# Patient Record
Sex: Female | Born: 1960 | Race: White | Hispanic: No | Marital: Married | Smoking: Never smoker
Health system: Southern US, Community
[De-identification: ages and names within clinical notes are randomized; demographics above are authoritative.]

## PROBLEM LIST (undated history)

## (undated) DIAGNOSIS — E079 Disorder of thyroid, unspecified: Secondary | ICD-10-CM

## (undated) DIAGNOSIS — C189 Malignant neoplasm of colon, unspecified: Secondary | ICD-10-CM

---

## 2019-09-20 ENCOUNTER — Ambulatory Visit: Payer: Self-pay | Attending: Internal Medicine

## 2019-09-20 DIAGNOSIS — Z23 Encounter for immunization: Secondary | ICD-10-CM

## 2019-09-20 NOTE — Progress Notes (Signed)
   Covid-19 Vaccination Clinic  Name:  Patricia Woods    MRN: [id number] DOB: 06/18/1961  09/20/2019  [name] was observed post Covid-19 immunization for 15 minutes without incident. She was provided with Vaccine Information Sheet and instruction to access the V-Safe system.   [name] was instructed to call 911 with any severe reactions post vaccine: Marland Kitchen Difficulty breathing  . Swelling of face and throat  . A fast heartbeat  . A bad rash all over body  . Dizziness and weakness   Immunizations Administered    Name Date Dose VIS Date Route   Pfizer COVID-19 Vaccine 09/20/2019  2:30 PM 0.3 mL 06/14/2019 Intramuscular   Manufacturer: [place]   Lot: [id number]   [place]: [id number]

## 2019-10-11 ENCOUNTER — Ambulatory Visit: Payer: Self-pay | Attending: Internal Medicine

## 2019-10-11 DIAGNOSIS — Z23 Encounter for immunization: Secondary | ICD-10-CM

## 2019-10-11 NOTE — Progress Notes (Signed)
   Covid-19 Vaccination Clinic  Name:  Danniell Morvant    MRN: [id number] DOB: 24-Nov-1960  10/11/2019  Ms. Slocumb was observed post Covid-19 immunization for 15 minutes without incident. She was provided with Vaccine Information Sheet and instruction to access the V-Safe system.   Ms. Proper was instructed to call 911 with any severe reactions post vaccine: Marland Kitchen Difficulty breathing  . Swelling of face and throat  . A fast heartbeat  . A bad rash all over body  . Dizziness and weakness   Immunizations Administered    Name Date Dose VIS Date Route   Pfizer COVID-19 Vaccine 10/11/2019  2:01 PM 0.3 mL 06/14/2019 Intramuscular   Manufacturer: [place]   Lot: [id number]   [place]: [id number]

## 2019-10-11 NOTE — Progress Notes (Signed)
   Covid-19 Vaccination Clinic  Name:  Patricia Woods    MRN: [id number] DOB: 08-Jul-1960  10/11/2019  Patricia Woods was observed post Covid-19 immunization for 15 minutes .  During the observation period, she experienced an adverse reaction with the following symptoms:  loss of consciousness, nausea and patient had left site after observation with spouse, spouse states he panicked and couldn't remember where the hospital was. As they were still close to the vaccine clinic site, he brought her back here..  Assessment : Time of assessment 1455. Alert and oriented, Pallor, Diaphoretic and Nausea.  Actions taken:  Vitals sign taken  VAERS form completed 1457  B/P 124/71    O2 Sat 99%    HR  80   Blood Glucose 149   Medications administered: No medication administered.  Disposition: Reports no further symptoms of adverse reaction after observation for 15 minutes. Discharged home. Instructed to call 911 for trouble breathing, rapid heart rate, dizziness, swelling of tongue or throat.   Immunizations Administered    Name Date Dose VIS Date Route   Pfizer COVID-19 Vaccine 10/11/2019  2:01 PM 0.3 mL 06/14/2019 Intramuscular   Manufacturer: [place]   Lot: [id number]   [place]: [id number]

## 2019-10-15 ENCOUNTER — Telehealth: Payer: Self-pay | Admitting: *Deleted

## 2019-10-15 NOTE — Telephone Encounter (Signed)
Called patient and left a voicemail in regards to their second COVID vaccine that she received on April 9th. Will follow up.

## 2019-10-15 NOTE — Telephone Encounter (Signed)
Patient called back and stated that when she received her vaccine she felt fine but when they were in the car riding home she started to feel dizzy and nauseous and passed out in the car. She doesn't remember passing her out, her husband drove her back to the facility where she was sweating and she did notice tingling in her hands. 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 She states that she has passed out a couple times during her chemo treatment in the past. She states that she does not have a history of allergic reactions to other vaccines, she chooses not to get the Flu vaccine because she does not like the flu like symptoms she gets from it. Please advise if there is any need for concern for future vaccine booster. Thank You.

## 2019-10-16 NOTE — Telephone Encounter (Signed)
Please call patient back.   As this occurred after her second covid vaccine nothing to do as of now as she is fully vaccinated.  In the future, if there are booster vaccinations that are needed then recommend to call our office back at that time. We just don't have much information about what to do with these patients who are fully vaccinated right now.

## 2019-10-16 NOTE — Telephone Encounter (Signed)
Called and left a voicemail asking for patient to return call to inform.  

## 2019-10-16 NOTE — Telephone Encounter (Signed)
Patient called back and was advised per Dr. Maudie Mercury. Patient verbalized understanding and will call back for future booster.

## 2019-12-04 ENCOUNTER — Ambulatory Visit (INDEPENDENT_AMBULATORY_CARE_PROVIDER_SITE_OTHER): Payer: Managed Care, Other (non HMO)

## 2019-12-04 ENCOUNTER — Other Ambulatory Visit: Payer: Self-pay

## 2019-12-04 ENCOUNTER — Ambulatory Visit
Admission: EM | Admit: 2019-12-04 | Discharge: 2019-12-04 | Disposition: A | Discharge status: Home / Self Care | Payer: Managed Care, Other (non HMO) | Attending: Family Medicine | Admitting: Family Medicine

## 2019-12-04 ENCOUNTER — Encounter: Payer: Self-pay | Admitting: Emergency Medicine

## 2019-12-04 DIAGNOSIS — S80211A Abrasion, right knee, initial encounter: Secondary | ICD-10-CM

## 2019-12-04 DIAGNOSIS — S8001XA Contusion of right knee, initial encounter: Secondary | ICD-10-CM | POA: Diagnosis not present

## 2019-12-04 HISTORY — DX: Disorder of thyroid, unspecified: E07.9

## 2019-12-04 HISTORY — DX: Malignant neoplasm of colon, unspecified: C18.9

## 2019-12-04 NOTE — ED Triage Notes (Signed)
Patient states she fell in a hole today and hit her right knee on a concrete curb. Patient has 2 skin abrasions to her right knee. Patient is c/o pain with walking.

## 2019-12-04 NOTE — Discharge Instructions (Addendum)
Rest, ice, elevation, over the counter, topical antibiotic for abrasion, over the counter ibuprofen/tylenol as needed

## 2019-12-08 NOTE — ED Provider Notes (Signed)
[facility]    CSN: [id number] Arrival date & time: 12/04/19  1434      History   Chief Complaint Chief Complaint  Patient presents with  . Knee Injury    right    HPI Patricia Woods is a 59 y.o. female.   59 yo female with a c/o right knee pain after injuring it today. States she fell in a hole today as she was getting out of her car and fell, hitting and scraping her right knee. Has some skin abrasions and c/o pain with ambulation.      Past Medical History:  Diagnosis Date  . Colon cancer ([place])   . Thyroid disease     There are no problems to display for this patient.   History reviewed. No pertinent surgical history.  OB History   No obstetric history on file.      Home Medications    Prior to Admission medications   Medication Sig Start Date End Date Taking? Authorizing Provider  fluticasone (FLONASE) 50 MCG/ACT nasal spray Place into the nose. 06/19/15  Yes [provider]  levothyroxine (SYNTHROID) 175 MCG tablet Take by mouth. 12/25/18  Yes [provider]    Family History History reviewed. No pertinent family history.  Social History Social History   Tobacco Use  . Smoking status: Never Smoker  . Smokeless tobacco: Never Used  Substance Use Topics  . Alcohol use: Yes  . Drug use: Never     Allergies   Patient has no known allergies.   Review of Systems Review of Systems   Physical Exam Triage Vital Signs ED Triage Vitals  Enc Vitals Group     BP 12/04/19 1451 (!) 143/92     Pulse Rate 12/04/19 1451 79     Resp 12/04/19 1451 18     Temp 12/04/19 1451 97.9 F (36.6 C)     Temp Source 12/04/19 1451 Oral     SpO2 12/04/19 1451 98 %     Weight 12/04/19 1448 180 lb (81.6 kg)     Height 12/04/19 1448 5\' 8"  (1.727 m)     Head Circumference --      Peak Flow --      Pain Score 12/04/19 1448 6     Pain Loc --      Pain Edu? --      Excl. in [place]? --    No data found.  Updated Vital Signs BP (!)  143/92 (BP Location: Right Arm)   Pulse 79   Temp 97.9 F (36.6 C) (Oral)   Resp 18   Ht 5\' 8"  (1.727 m)   Wt 81.6 kg   SpO2 98%   BMI 27.37 kg/m   Visual Acuity Right Eye Distance:   Left Eye Distance:   Bilateral Distance:    Right Eye Near:   Left Eye Near:    Bilateral Near:     Physical Exam Vitals and nursing note reviewed.  Constitutional:      General: She is not in acute distress.    Appearance: She is not toxic-appearing or diaphoretic.  Musculoskeletal:     Right knee: Swelling (mild) present. No deformity, effusion, erythema, ecchymosis, lacerations or crepitus. Tenderness (diffuse ) present. Normal alignment, normal meniscus and normal patellar mobility. Normal pulse.     Comments: Right lower extremity neurovascularly intact; superficial skin abrasions noted over the knee  Neurological:     Mental Status: She is alert.  UC Treatments / Results  Labs (all labs ordered are listed, but only abnormal results are displayed) Labs Reviewed - No data to display  EKG   Radiology No results found.  Procedures Procedures (including critical care time)  Medications Ordered in UC Medications - No data to display  Initial Impression / Assessment and Plan / UC Course  I have reviewed the triage vital signs and the nursing notes.  Pertinent labs & imaging results that were available during my care of the patient were reviewed by me and considered in my medical decision making (see chart for details).      Final Clinical Impressions(s) / UC Diagnoses   Final diagnoses:  Contusion of right knee, initial encounter  Abrasion, right knee, initial encounter     Discharge Instructions     Rest, ice, elevation, over the counter, topical antibiotic for abrasion, over the counter ibuprofen/tylenol as needed    ED Prescriptions    None      1. x-ray results (negative) and diagnosis reviewed with patient 2. Recommend supportive treatment as  above 3. Follow-up prn if symptoms worsen or don't improve   PDMP not reviewed this encounter.   Norval Gable, MD 12/08/19 1131

## 2021-08-20 IMAGING — CR DG KNEE COMPLETE 4+V*R*
4 series · 4 of 4 positions shown · non-contrast
Comparison: None.

CLINICAL DATA: Fall, knee injury, pain

EXAM:
RIGHT KNEE - COMPLETE 4+ VIEW

[knee ap]
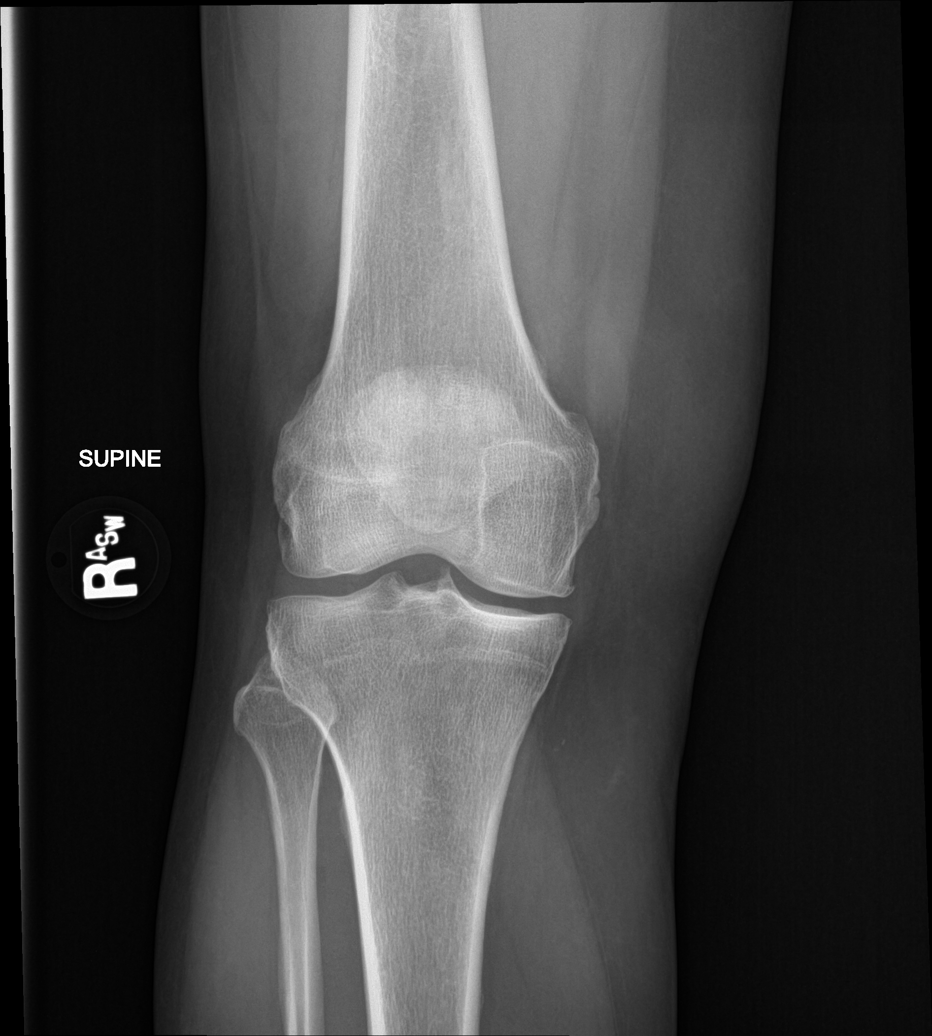

[knee lat]
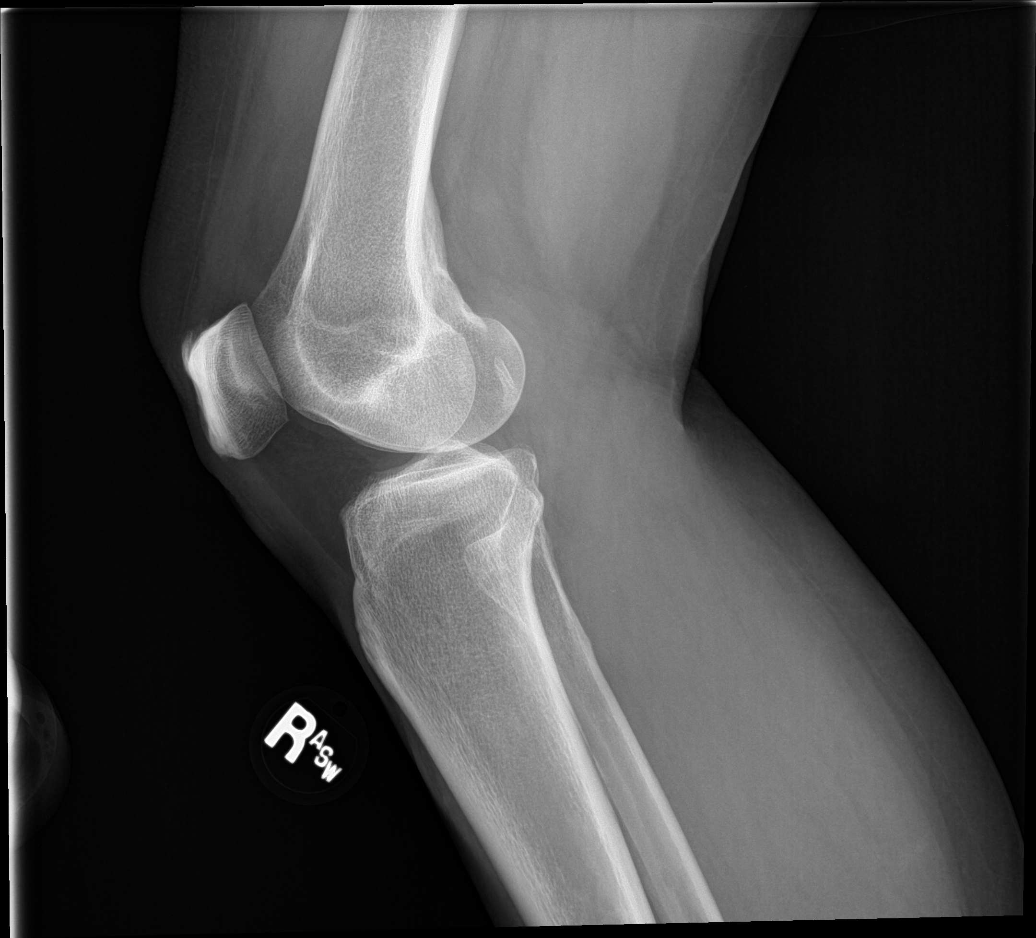

[knee obl (1 of 2)]
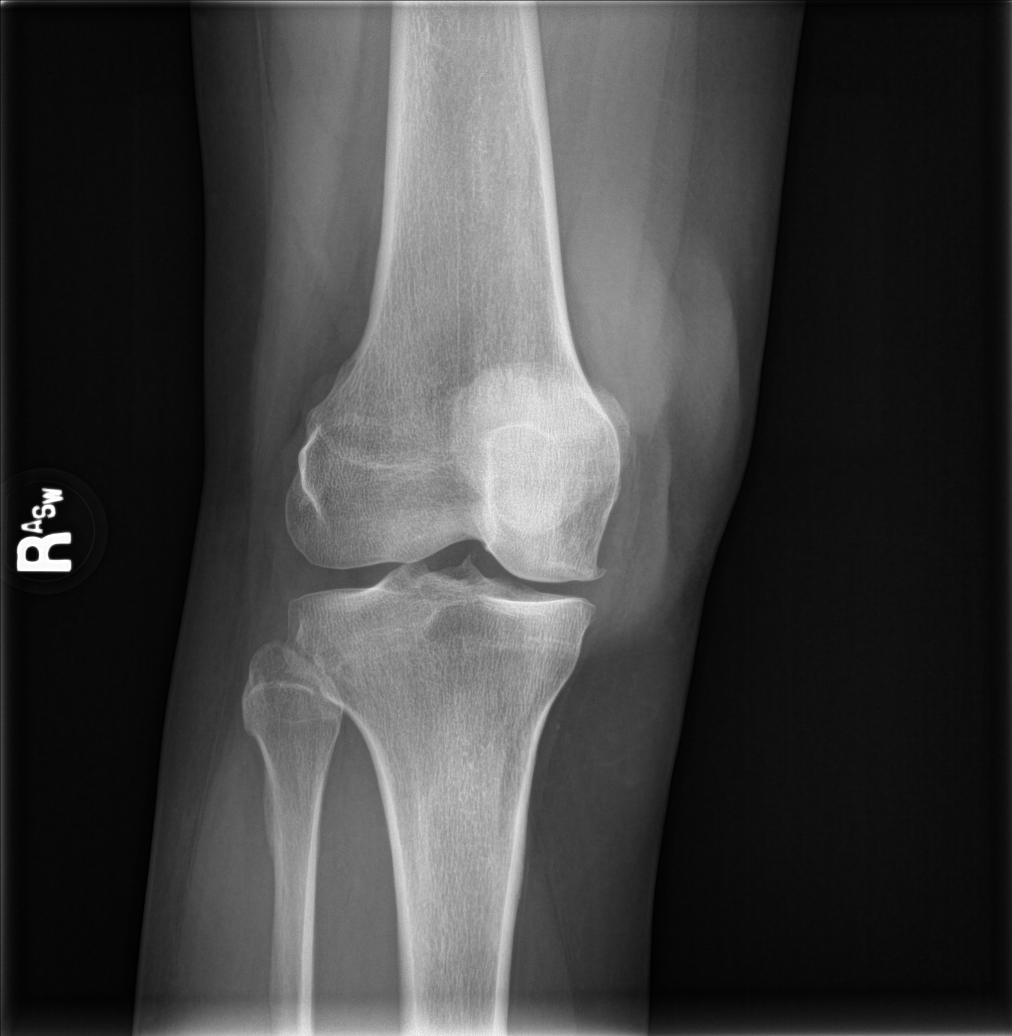

[knee obl (2 of 2)]
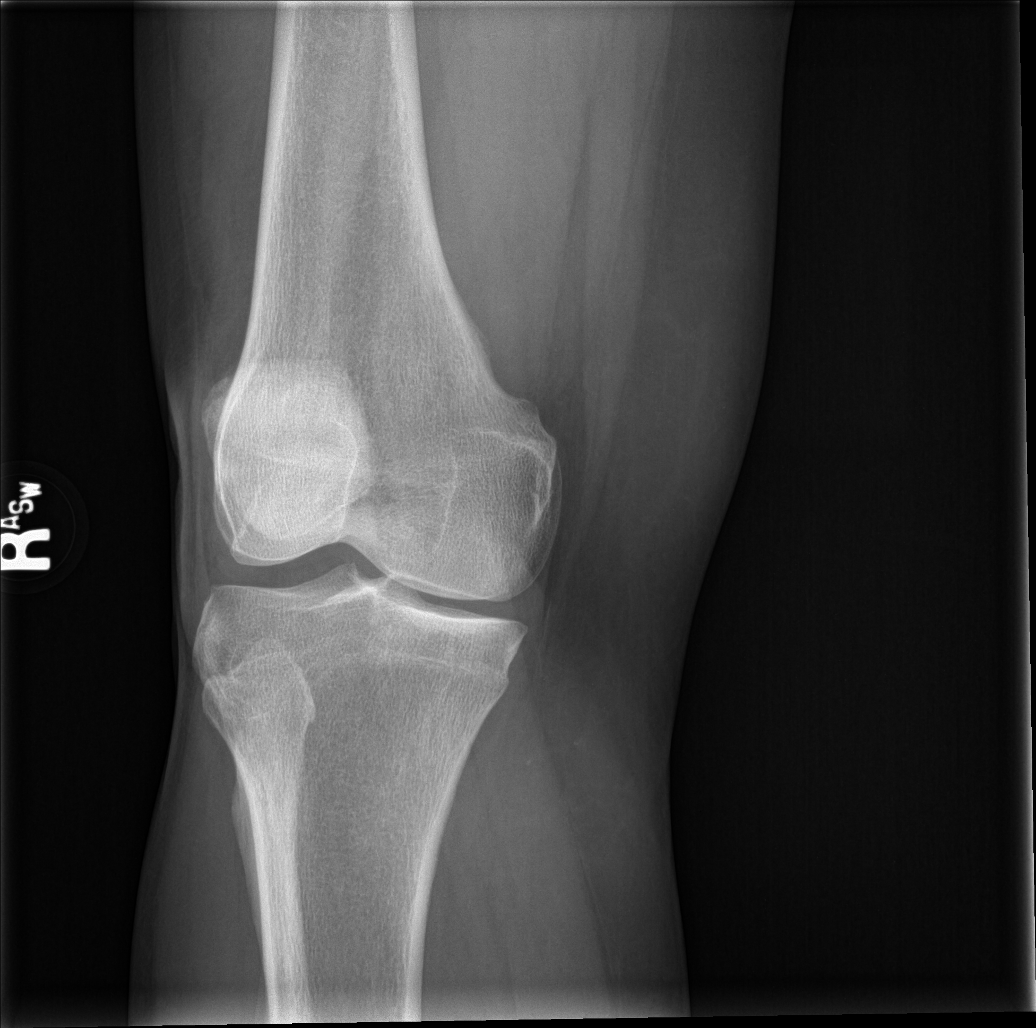

[4 of 4 positions shown; findings below may reference images not displayed]

FINDINGS: Early joint space narrowing and spurring in the medial compartment.
No acute bony abnormality. Specifically, no fracture, subluxation,
or dislocation. No joint effusion.
IMPRESSION: No acute bony abnormality.
